# Patient Record
Sex: Male | Born: 1970 | Race: White | Hispanic: No | Marital: Single | Smoking: Current every day smoker
Health system: Southern US, Community
[De-identification: ages and names within clinical notes are randomized; demographics above are authoritative.]

## PROBLEM LIST (undated history)

## (undated) DIAGNOSIS — I10 Essential (primary) hypertension: Secondary | ICD-10-CM

## (undated) HISTORY — PX: KNEE SURGERY: SHX244

---

## 2010-03-07 ENCOUNTER — Emergency Department (HOSPITAL_COMMUNITY): Admission: EM | Admit: 2010-03-07 | Discharge: 2010-03-08 | Payer: Self-pay | Admitting: Emergency Medicine

## 2010-04-05 ENCOUNTER — Emergency Department (HOSPITAL_COMMUNITY): Admission: EM | Admit: 2010-04-05 | Discharge: 2010-04-05 | Payer: Self-pay | Admitting: Emergency Medicine

## 2010-07-15 ENCOUNTER — Emergency Department (HOSPITAL_COMMUNITY)
Admission: EM | Admit: 2010-07-15 | Discharge: 2010-07-15 | Payer: Self-pay | Source: Home / Self Care | Admitting: Emergency Medicine

## 2011-01-30 ENCOUNTER — Encounter: Payer: Self-pay | Admitting: *Deleted

## 2011-01-30 ENCOUNTER — Emergency Department (HOSPITAL_COMMUNITY)
Admission: EM | Admit: 2011-01-30 | Discharge: 2011-01-30 | Disposition: A | Discharge status: Home / Self Care | Payer: Self-pay | Attending: Emergency Medicine | Admitting: Emergency Medicine

## 2011-01-30 DIAGNOSIS — H6120 Impacted cerumen, unspecified ear: Secondary | ICD-10-CM | POA: Insufficient documentation

## 2011-01-30 DIAGNOSIS — K089 Disorder of teeth and supporting structures, unspecified: Secondary | ICD-10-CM | POA: Insufficient documentation

## 2011-01-30 DIAGNOSIS — K0889 Other specified disorders of teeth and supporting structures: Secondary | ICD-10-CM

## 2011-01-30 DIAGNOSIS — F17200 Nicotine dependence, unspecified, uncomplicated: Secondary | ICD-10-CM | POA: Insufficient documentation

## 2011-01-30 MED ORDER — HYDROCODONE-ACETAMINOPHEN 5-325 MG PO TABS: ORAL_TABLET | ORAL | Status: AC

## 2011-01-30 MED ORDER — PENICILLIN V POTASSIUM 250 MG PO TABS: 250.0000 mg | ORAL_TABLET | Freq: Four times a day (QID) | ORAL | Status: AC

## 2011-01-30 NOTE — ED Provider Notes (Signed)
History     CSN: [id number] Arrival date & time: 01/30/2011  6:30 PM  Chief Complaint  Patient presents with  . Dental Pain   HPI Pt was seen at 1940.  Per pt, c/o gradual onset and persistence of constant left lower tooth "pain" and well as left ear "stopped up with wax" x3 days.  States he tried OTC ear wax drop but "they didn't work."  Denies fevers, no intra-oral edema, no rash, no facial swelling, no dysphagia, no neck pain.   The condition is aggravated by nothing. The condition is relieved by nothing. The symptoms have been associated with no other complaints. The patient has no significant history of serious medical conditions.   History reviewed. No pertinent past medical history.  Past Surgical History  Procedure Date  . Knee surgery     rt    History reviewed. No pertinent family history.  History  Substance Use Topics  . Smoking status: Current Everyday Smoker -- 1.0 packs/day  . Smokeless tobacco: Not on file  . Alcohol Use: No      Review of Systems ROS: Statement: All systems negative except as marked or noted in the HPI; Constitutional: Negative for fever and chills. ; ; Eyes: Negative for eye pain and discharge. ; ; ENMT: Positive for dental caries, dental hygiene poor and toothache. Negative for ear pain, bleeding gums, dental injury, facial deformity, facial swelling, hoarseness, nasal congestion, sinus pressure, sore throat, throat swelling and tongue swollen. ; ; Cardiovascular: Negative for chest pain, palpitations, diaphoresis, dyspnea and peripheral edema. ; ; Respiratory: Negative for cough, wheezing and stridor. ; ; Gastrointestinal: Negative for nausea, vomiting, diarrhea and abdominal pain. ; ; Genitourinary: Negative for dysuria, flank pain and hematuria. ; ; Musculoskeletal: Negative for back pain and neck pain. ; ; Skin: Negative for rash and skin lesion. ; ; Neuro: Negative for headache, lightheadedness and neck stiffness.     Physical Exam  BP  148/98  Pulse 83  Temp(Src) 97.4 F (36.3 C) (Oral)  Resp 16  Ht 5\' 9"  (1.753 m)  Wt 165 lb (74.844 kg)  BMI 24.37 kg/m2  SpO2 100%  Physical Exam 1945: Physical examination: Vital signs and O2 SAT: Reviewed; Constitutional: Well developed, Well nourished, Well hydrated, In no acute distress; Head and Face: Normocephalic, Atraumatic; Eyes: EOMI, PERRL, No scleral icterus; ENMT: Mouth and pharynx normal, Poor dentition, Widespread dental decay, Left TM obscured by wax, canal without debris/edema/rash, Right TM normal, Mucous membranes moist, +lower left 1st and 2nd molars with extensive dental decay.  No gingival erythema, edema, fluctuance, or drainage.  No hoarse voice, no drooling, no stridor.  ; Neck: Supple, Full range of motion, No lymphadenopathy; Cardiovascular: Regular rate and rhythm, No murmur, rub, or gallop; Respiratory: Breath sounds clear & equal bilaterally, No rales, rhonchi, wheezes, or rub, Normal respiratory effort/excursion; Chest: Nontender, Movement normal; Extremities: Pulses normal, No tenderness, No edema; Neuro: AA&Ox3, Major CN grossly intact.  No gross focal motor or sensory deficits in extremities.; Skin: Color normal, No rash, No petechiae, Warm, Dry.   ED Course  Procedures  MDM MDM Reviewed: nursing note and vitals   7:52 PM:  Will have RN attempt to flush left ear of cerumen impaction.  Pt endorses he "just put the drops in and let them run out again."  Instructed further regarding home ear wax removal.  Verb understanding.     8:30 PM:  Wants to leave, does not want ear wax removal attempt in ED.  Will d/c.    Mylin Gignac Allison Quarry, DO 01/31/11 1411

## 2011-01-30 NOTE — ED Notes (Signed)
Toothache x 3 days. Unable to hear out of left ear x 3 days.

## 2011-04-12 ENCOUNTER — Emergency Department (HOSPITAL_COMMUNITY)
Admission: EM | Admit: 2011-04-12 | Discharge: 2011-04-12 | Disposition: A | Discharge status: Home / Self Care | Payer: Self-pay | Attending: Emergency Medicine | Admitting: Emergency Medicine

## 2011-04-12 ENCOUNTER — Encounter (HOSPITAL_COMMUNITY): Payer: Self-pay | Admitting: *Deleted

## 2011-04-12 DIAGNOSIS — K089 Disorder of teeth and supporting structures, unspecified: Secondary | ICD-10-CM

## 2011-04-12 DIAGNOSIS — F17200 Nicotine dependence, unspecified, uncomplicated: Secondary | ICD-10-CM | POA: Insufficient documentation

## 2011-04-12 DIAGNOSIS — R42 Dizziness and giddiness: Secondary | ICD-10-CM

## 2011-04-12 DIAGNOSIS — K029 Dental caries, unspecified: Secondary | ICD-10-CM | POA: Insufficient documentation

## 2011-04-12 LAB — BASIC METABOLIC PANEL
CO2: 26 mEq/L (ref 19–32)
Chloride: 104 mEq/L (ref 96–112)
Glucose, Bld: 101 mg/dL — ABNORMAL HIGH (ref 70–99)
Potassium: 4.2 mEq/L (ref 3.5–5.1)
Sodium: 137 mEq/L (ref 135–145)

## 2011-04-12 LAB — CBC
Hemoglobin: 14.5 g/dL (ref 13.0–17.0)
Platelets: 325 10*3/uL (ref 150–400)
RBC: 4.45 MIL/uL (ref 4.22–5.81)
WBC: 7.2 10*3/uL (ref 4.0–10.5)

## 2011-04-12 MED ORDER — HYDROCODONE-ACETAMINOPHEN 5-325 MG PO TABS: 1.0000 | ORAL_TABLET | Freq: Four times a day (QID) | ORAL | Status: AC | PRN

## 2011-04-12 MED ORDER — IBUPROFEN 800 MG PO TABS
800.0000 mg | ORAL_TABLET | Freq: Once | ORAL | Status: AC
  Administered 2011-04-12: 800 mg via ORAL
  Filled 2011-04-12: qty 1

## 2011-04-12 MED ORDER — PENICILLIN V POTASSIUM 500 MG PO TABS: 500.0000 mg | ORAL_TABLET | Freq: Four times a day (QID) | ORAL | Status: AC

## 2011-04-12 MED ORDER — PENICILLIN V POTASSIUM 250 MG PO TABS
500.0000 mg | ORAL_TABLET | Freq: Once | ORAL | Status: AC
  Administered 2011-04-12: 500 mg via ORAL
  Filled 2011-04-12: qty 2

## 2011-04-12 MED ORDER — HYDROCODONE-ACETAMINOPHEN 5-325 MG PO TABS
1.0000 | ORAL_TABLET | Freq: Once | ORAL | Status: AC
  Administered 2011-04-12: 1 via ORAL
  Filled 2011-04-12: qty 1

## 2011-04-12 NOTE — ED Provider Notes (Signed)
History     CSN: [id number] Arrival date & time: 04/12/2011 10:06 AM   First MD Initiated Contact with Patient 04/12/11 1037      Chief Complaint  Patient presents with  . Dizziness    (Consider location/radiation/quality/duration/timing/severity/associated sxs/prior treatment) HPI Comments: Pt complains of intermittent dizziness for the past 2 weeks.  He has oral pain with significant dental disease.  All the upper teeth are black and decayed to the gumline.  He thinks he may have "blood poisoning".  No fever or chills.  No head trama.  No focal neurologic complaints.  The history is provided by the patient. No language interpreter was used.    History reviewed. No pertinent past medical history.  Past Surgical History  Procedure Date  . Knee surgery     rt    History reviewed. No pertinent family history.  History  Substance Use Topics  . Smoking status: Current Everyday Smoker -- 1.0 packs/day  . Smokeless tobacco: Not on file  . Alcohol Use: No      Review of Systems  Constitutional: Negative for fever and chills.  HENT: Positive for dental problem.   Neurological: Positive for dizziness. Negative for syncope, facial asymmetry, speech difficulty, weakness and headaches.  All other systems reviewed and are negative.    Allergies  Review of patient's allergies indicates no known allergies.  Home Medications   Current Outpatient Rx  Name Route Sig Dispense Refill  . IBUPROFEN 200 MG PO TABS Oral Take 600 mg by mouth once as needed. For pain    . EAR DROPS OT Otic Place 5 drops in ear(s) once as needed. For wax reomoval       BP 141/97  Pulse 100  Temp(Src) 98 F (36.7 C) (Oral)  Resp 18  Ht 5\' 8"  (1.727 m)  Wt 165 lb (74.844 kg)  BMI 25.09 kg/m2  SpO2 100%  Physical Exam  Nursing note and vitals reviewed. Constitutional: He is oriented to person, place, and time. He appears well-developed and well-nourished.  HENT:  Head: Normocephalic and  atraumatic.  Mouth/Throat: Uvula is midline. Abnormal dentition. Dental caries present. No uvula swelling.       All upper teeth are decayed to the gumline.  Eyes: EOM are normal.  Neck: Normal range of motion.  Cardiovascular: Normal rate, regular rhythm, normal heart sounds and intact distal pulses.   Pulmonary/Chest: Effort normal and breath sounds normal. No respiratory distress.  Abdominal: Soft. He exhibits no distension. There is no tenderness.  Musculoskeletal: Normal range of motion.  Neurological: He is alert and oriented to person, place, and time. No cranial nerve deficit. Coordination normal.  Skin: Skin is warm and dry.  Psychiatric: He has a normal mood and affect. Judgment normal.    ED Course  Procedures (including critical care time)  Labs Reviewed  BASIC METABOLIC PANEL - Abnormal; Notable for the following:    Glucose, Bld 101 (*)    GFR calc non Af Amer 81 (*)    All other components within normal limits  CBC   No results found.   No diagnosis found.    MDM        Worthy Rancher, PA 04/12/11 1210

## 2011-04-12 NOTE — ED Notes (Signed)
Pt c/o dizziness and dental pain. Pt states he has had dental pain for a long time and feels like he has an infection.

## 2011-04-14 NOTE — ED Provider Notes (Signed)
Medical screening examination/treatment/procedure(s) were performed by non-physician practitioner and as supervising physician I was immediately available for consultation/collaboration.   Alby L Lawana Hartzell, MD 04/14/11 0903 

## 2011-08-22 ENCOUNTER — Encounter (HOSPITAL_COMMUNITY): Payer: Self-pay | Admitting: *Deleted

## 2011-08-22 ENCOUNTER — Emergency Department (HOSPITAL_COMMUNITY)
Admission: EM | Admit: 2011-08-22 | Discharge: 2011-08-22 | Disposition: A | Discharge status: Home / Self Care | Payer: Self-pay | Attending: Emergency Medicine | Admitting: Emergency Medicine

## 2011-08-22 DIAGNOSIS — K044 Acute apical periodontitis of pulpal origin: Secondary | ICD-10-CM | POA: Insufficient documentation

## 2011-08-22 DIAGNOSIS — K0381 Cracked tooth: Secondary | ICD-10-CM | POA: Insufficient documentation

## 2011-08-22 DIAGNOSIS — K029 Dental caries, unspecified: Secondary | ICD-10-CM | POA: Insufficient documentation

## 2011-08-22 DIAGNOSIS — K047 Periapical abscess without sinus: Secondary | ICD-10-CM

## 2011-08-22 DIAGNOSIS — F17200 Nicotine dependence, unspecified, uncomplicated: Secondary | ICD-10-CM | POA: Insufficient documentation

## 2011-08-22 MED ORDER — AMOXICILLIN 250 MG PO CAPS
500.0000 mg | ORAL_CAPSULE | Freq: Once | ORAL | Status: AC
  Administered 2011-08-22: 500 mg via ORAL
  Filled 2011-08-22: qty 2

## 2011-08-22 MED ORDER — AMOXICILLIN 500 MG PO CAPS: 500.0000 mg | ORAL_CAPSULE | Freq: Three times a day (TID) | ORAL | Status: AC

## 2011-08-22 MED ORDER — HYDROCODONE-ACETAMINOPHEN 5-325 MG PO TABS
1.0000 | ORAL_TABLET | Freq: Once | ORAL | Status: AC
  Administered 2011-08-22: 1 via ORAL
  Filled 2011-08-22: qty 1

## 2011-08-22 MED ORDER — HYDROCODONE-ACETAMINOPHEN 5-325 MG PO TABS: 1.0000 | ORAL_TABLET | Freq: Once | ORAL | Status: AC

## 2011-08-22 NOTE — ED Provider Notes (Signed)
Medical screening examination/treatment/procedure(s) were performed by non-physician practitioner and as supervising physician I was immediately available for consultation/collaboration.   Renner L Takako Minckler, MD 08/22/11 2316 

## 2011-08-22 NOTE — ED Notes (Signed)
Lt dental pain for several days

## 2011-08-22 NOTE — ED Notes (Signed)
Pt dc to home with steady gait. 

## 2011-08-22 NOTE — ED Provider Notes (Signed)
History     CSN: [id number]  Arrival date & time 08/22/11  4098   First MD Initiated Contact with Patient 08/22/11 1930      Chief Complaint  Patient presents with  . Dental Pain    (Consider location/radiation/quality/duration/timing/severity/associated sxs/prior treatment) Patient is a 41 y.o. male presenting with tooth pain. The history is provided by the patient.  Dental PainThe primary symptoms include mouth pain. Primary symptoms do not include headaches, fever, shortness of breath or sore throat. Primary symptoms comment: Patient has chronically poor dentition with multiple fractured teeth down to the gingival line, predominantly involving his bilateral upper and lower molars. The symptoms began 2 days ago. The symptoms are worsening. The symptoms are recurrent. The symptoms occur constantly.  Mouth pain began 24 -48 hours ago. Mouth pain occurs constantly. Affected locations include: teeth and gum(s). At its highest the mouth pain was at 8/10. The mouth pain is currently at 8/10.  Additional symptoms include: dental sensitivity to temperature, gum tenderness, jaw pain and ear pain. Additional symptoms do not include: gum swelling, trismus, facial swelling, trouble swallowing, taste disturbance, hearing loss and swollen glands.    History reviewed. No pertinent past medical history.  Past Surgical History  Procedure Date  . Knee surgery     rt    History reviewed. No pertinent family history.  History  Substance Use Topics  . Smoking status: Current Everyday Smoker -- 1.0 packs/day  . Smokeless tobacco: Not on file  . Alcohol Use: No      Review of Systems  Constitutional: Negative for fever.  HENT: Positive for ear pain and dental problem. Negative for hearing loss, congestion, sore throat, facial swelling, trouble swallowing and neck pain.   Eyes: Negative.   Respiratory: Negative for chest tightness and shortness of breath.   Cardiovascular: Negative for chest  pain.  Gastrointestinal: Negative for nausea and abdominal pain.  Genitourinary: Negative.   Musculoskeletal: Negative for joint swelling and arthralgias.  Skin: Negative.  Negative for rash and wound.  Neurological: Negative for dizziness, weakness, light-headedness, numbness and headaches.  Hematological: Negative.   Psychiatric/Behavioral: Negative.     Allergies  Review of patient's allergies indicates no known allergies.  Home Medications   Current Outpatient Rx  Name Route Sig Dispense Refill  . AMOXICILLIN 500 MG PO CAPS Oral Take 1 capsule (500 mg total) by mouth 3 (three) times daily. 30 capsule 0  . EAR DROPS OT Otic Place 5 drops in ear(s) once as needed. For wax reomoval     . HYDROCODONE-ACETAMINOPHEN 5-325 MG PO TABS Oral Take 1 tablet by mouth once. 20 tablet 0  . IBUPROFEN 200 MG PO TABS Oral Take 600 mg by mouth once as needed. For pain      BP 151/105  Pulse 104  Temp(Src) 98.2 F (36.8 C) (Oral)  Resp 20  Ht 5\' 8"  (1.727 m)  Wt 160 lb (72.576 kg)  BMI 24.33 kg/m2  SpO2 100%  Physical Exam  Constitutional: He is oriented to person, place, and time. He appears well-developed and well-nourished. No distress.  HENT:  Head: Normocephalic and atraumatic.  Right Ear: Tympanic membrane and external ear normal.  Left Ear: Tympanic membrane and external ear normal.  Mouth/Throat: Oropharynx is clear and moist and mucous membranes are normal. No oral lesions. Dental caries present. No dental abscesses.  Eyes: Conjunctivae are normal.  Neck: Normal range of motion. Neck supple.  Cardiovascular: Normal rate and normal heart sounds.   Pulmonary/Chest: Effort  normal.  Abdominal: He exhibits no distension.  Musculoskeletal: Normal range of motion.  Lymphadenopathy:    He has no cervical adenopathy.  Neurological: He is alert and oriented to person, place, and time.  Skin: Skin is warm and dry. No erythema.  Psychiatric: He has a normal mood and affect.    ED  Course  Procedures (including critical care time)  Labs Reviewed - No data to display No results found.   1. Dental infection       MDM  Severe diffuse dental disease with recurrent increased pain, suggestive of possible early abscess/gingivitis.  Will treat with penicillin, hydrocodone.  Encouraged peroxide and water "swish and spit" treatment.  We'll give dental referral list.       Candis Musa, [place] 08/22/11 [phone]

## 2012-04-24 ENCOUNTER — Emergency Department (HOSPITAL_COMMUNITY): Payer: Self-pay

## 2012-04-24 ENCOUNTER — Emergency Department (HOSPITAL_COMMUNITY)
Admission: EM | Admit: 2012-04-24 | Discharge: 2012-04-24 | Disposition: A | Discharge status: Home / Self Care | Payer: Self-pay | Attending: Emergency Medicine | Admitting: Emergency Medicine

## 2012-04-24 ENCOUNTER — Encounter (HOSPITAL_COMMUNITY): Payer: Self-pay | Admitting: *Deleted

## 2012-04-24 DIAGNOSIS — S59909A Unspecified injury of unspecified elbow, initial encounter: Secondary | ICD-10-CM | POA: Insufficient documentation

## 2012-04-24 DIAGNOSIS — F17200 Nicotine dependence, unspecified, uncomplicated: Secondary | ICD-10-CM | POA: Insufficient documentation

## 2012-04-24 DIAGNOSIS — Y93E9 Activity, other interior property and clothing maintenance: Secondary | ICD-10-CM | POA: Insufficient documentation

## 2012-04-24 DIAGNOSIS — Y929 Unspecified place or not applicable: Secondary | ICD-10-CM | POA: Insufficient documentation

## 2012-04-24 DIAGNOSIS — S6990XA Unspecified injury of unspecified wrist, hand and finger(s), initial encounter: Secondary | ICD-10-CM | POA: Insufficient documentation

## 2012-04-24 DIAGNOSIS — M25521 Pain in right elbow: Secondary | ICD-10-CM

## 2012-04-24 DIAGNOSIS — X503XXA Overexertion from repetitive movements, initial encounter: Secondary | ICD-10-CM | POA: Insufficient documentation

## 2012-04-24 MED ORDER — HYDROCODONE-ACETAMINOPHEN 5-325 MG PO TABS: 1.0000 | ORAL_TABLET | Freq: Four times a day (QID) | ORAL | Status: AC | PRN

## 2012-04-24 MED ORDER — HYDROCODONE-ACETAMINOPHEN 5-325 MG PO TABS
1.0000 | ORAL_TABLET | Freq: Once | ORAL | Status: AC
  Administered 2012-04-24: 1 via ORAL
  Filled 2012-04-24: qty 1

## 2012-04-24 NOTE — ED Provider Notes (Signed)
History     CSN: [id number]  Arrival date & time 04/24/12  1751   None     Chief Complaint  Patient presents with  . Elbow Pain    (Consider location/radiation/quality/duration/timing/severity/associated sxs/prior treatment) HPI Comments: Pt was raking leaves and felt a "pop" in his R AC fossa.  He has had pain since and is unable to fully extend elbow b/c of pain.  R hand dominant.  No other complaints.  The history is provided by the patient. No language interpreter was used.    History reviewed. No pertinent past medical history.  Past Surgical History  Procedure Date  . Knee surgery     rt    History reviewed. No pertinent family history.  History  Substance Use Topics  . Smoking status: Current Every Day Smoker -- 1.0 packs/day  . Smokeless tobacco: Not on file  . Alcohol Use: No      Review of Systems  Musculoskeletal:       Elbow pain   Neurological: Negative for weakness and numbness.  All other systems reviewed and are negative.    Allergies  Review of patient's allergies indicates no known allergies.  Home Medications   Current Outpatient Rx  Name  Route  Sig  Dispense  Refill  . EAR DROPS OT   Otic   Place 5 drops in ear(s) once as needed. For wax reomoval          . IBUPROFEN 200 MG PO TABS   Oral   Take 600 mg by mouth once as needed. For pain           BP 170/108  Pulse 78  Temp 98.2 F (36.8 C) (Oral)  Resp 20  Ht 5\' 8"  (1.727 m)  Wt 160 lb (72.576 kg)  BMI 24.33 kg/m2  SpO2 99%  Physical Exam  Nursing note and vitals reviewed. Constitutional: He is oriented to person, place, and time. He appears well-developed and well-nourished.  HENT:  Head: Normocephalic and atraumatic.  Eyes: EOM are normal.  Neck: Normal range of motion.  Cardiovascular: Normal rate, regular rhythm and intact distal pulses.   Pulmonary/Chest: Effort normal. No respiratory distress.  Abdominal: Soft. He exhibits no distension. There is no  tenderness.  Musculoskeletal:       Left elbow: He exhibits decreased range of motion. He exhibits no swelling, no effusion, no deformity and no laceration. tenderness found.       Arms: Neurological: He is alert and oriented to person, place, and time.  Skin: Skin is warm and dry.  Psychiatric: He has a normal mood and affect. Judgment normal.    ED Course  Procedures (including critical care time)  Labs Reviewed - No data to display Dg Elbow Complete Right  04/24/2012  *RADIOLOGY REPORT*  Clinical Data: Pain  RIGHT ELBOW - COMPLETE 3+ VIEW  Comparison: April 05, 2010  Findings:  Frontal, lateral, and bilateral oblique views were obtained. There is a spur arising from the olecranon.  There is a fracture of the spur, nondisplaced.  There is evidence of a prior a avulsion along the anterior, lateral aspect of the proximal radius. This is a stable finding.  There is evidence of old trauma with ossification in the anterior aspect of the joint, a stable finding.  There is no appreciable joint effusion.  No dislocation.  There is a moderate generalized joint space narrowing.  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  Age uncertain fracture of the olecranon spur.  Original Report Authenticated By: Bretta Bang, M.D.      1. Right elbow pain       MDM  Sling, ice Ibuprofen rx-hydrocodone, 20 F/u with dr. Hilda Lias ASAP.        Evalina Field, [place] 04/24/12 1902

## 2012-04-24 NOTE — ED Provider Notes (Signed)
Medical screening examination/treatment/procedure(s) were performed by non-physician practitioner and as supervising physician I was immediately available for consultation/collaboration.  Magally Vahle, MD 04/24/12 1957 

## 2012-04-24 NOTE — ED Notes (Signed)
Pain rt elbow while raking leaves. Has small swollen area, No known injury, Good radial pulse , Cannot fully extend arm

## 2013-01-13 ENCOUNTER — Emergency Department (HOSPITAL_COMMUNITY): Payer: Self-pay

## 2013-01-13 ENCOUNTER — Encounter (HOSPITAL_COMMUNITY): Payer: Self-pay | Admitting: Emergency Medicine

## 2013-01-13 ENCOUNTER — Emergency Department (HOSPITAL_COMMUNITY)
Admission: EM | Admit: 2013-01-13 | Discharge: 2013-01-13 | Disposition: A | Discharge status: Home / Self Care | Payer: Self-pay | Attending: Emergency Medicine | Admitting: Emergency Medicine

## 2013-01-13 DIAGNOSIS — Z23 Encounter for immunization: Secondary | ICD-10-CM | POA: Insufficient documentation

## 2013-01-13 DIAGNOSIS — S51032A Puncture wound without foreign body of left elbow, initial encounter: Secondary | ICD-10-CM

## 2013-01-13 DIAGNOSIS — Y9389 Activity, other specified: Secondary | ICD-10-CM | POA: Insufficient documentation

## 2013-01-13 DIAGNOSIS — S51009A Unspecified open wound of unspecified elbow, initial encounter: Secondary | ICD-10-CM | POA: Insufficient documentation

## 2013-01-13 DIAGNOSIS — Y929 Unspecified place or not applicable: Secondary | ICD-10-CM | POA: Insufficient documentation

## 2013-01-13 DIAGNOSIS — W268XXA Contact with other sharp object(s), not elsewhere classified, initial encounter: Secondary | ICD-10-CM | POA: Insufficient documentation

## 2013-01-13 DIAGNOSIS — F17200 Nicotine dependence, unspecified, uncomplicated: Secondary | ICD-10-CM | POA: Insufficient documentation

## 2013-01-13 MED ORDER — CEFTRIAXONE SODIUM 1 G IJ SOLR
1.0000 g | Freq: Once | INTRAMUSCULAR | Status: AC
  Administered 2013-01-13: 1 g via INTRAMUSCULAR
  Filled 2013-01-13: qty 10

## 2013-01-13 MED ORDER — HYDROCODONE-ACETAMINOPHEN 5-325 MG PO TABS: 1.0000 | ORAL_TABLET | ORAL | Status: DC | PRN

## 2013-01-13 MED ORDER — KETOROLAC TROMETHAMINE 10 MG PO TABS
10.0000 mg | ORAL_TABLET | Freq: Once | ORAL | Status: AC
  Administered 2013-01-13: 10 mg via ORAL
  Filled 2013-01-13: qty 1

## 2013-01-13 MED ORDER — TETANUS-DIPHTH-ACELL PERTUSSIS 5-2.5-18.5 LF-MCG/0.5 IM SUSP
0.5000 mL | Freq: Once | INTRAMUSCULAR | Status: AC
  Administered 2013-01-13: 0.5 mL via INTRAMUSCULAR
  Filled 2013-01-13: qty 0.5

## 2013-01-13 MED ORDER — ACETAMINOPHEN 500 MG PO TABS
1000.0000 mg | ORAL_TABLET | Freq: Once | ORAL | Status: AC
  Administered 2013-01-13: 1000 mg via ORAL
  Filled 2013-01-13: qty 2

## 2013-01-13 MED ORDER — SULFAMETHOXAZOLE-TRIMETHOPRIM 800-160 MG PO TABS: 1.0000 | ORAL_TABLET | Freq: Two times a day (BID) | ORAL | Status: DC

## 2013-01-13 NOTE — ED Notes (Signed)
Roofing yesterday - slipped and struck elbow on a screw - today has pain, swelling and redness to elbow

## 2013-01-13 NOTE — ED Provider Notes (Signed)
Medical screening examination/treatment/procedure(s) were performed by non-physician practitioner and as supervising physician I was immediately available for consultation/collaboration. Devoria Albe, MD, Armando Gang   Ward Givens, MD 01/13/13 [phone]

## 2013-01-13 NOTE — ED Provider Notes (Signed)
CSN: [id number]     Arrival date & time 01/13/13  1910 History     First MD Initiated Contact with Patient 01/13/13 1933     Chief Complaint  Patient presents with  . Arm Injury   (Consider location/radiation/quality/duration/timing/severity/associated sxs/prior Treatment) Patient is a 42 y.o. male presenting with arm injury. The history is provided by the patient.  Arm Injury Location:  Elbow Time since incident:  1 day Injury: yes   Mechanism of injury comment:  Puncture wound with a screw, while working on a roof. Elbow location:  L elbow Pain details:    Quality:  Aching and shooting   Radiates to:  L arm   Severity:  Moderate   Onset quality:  Gradual   Timing:  Constant   Progression:  Worsening Chronicity:  New Handedness:  Right-handed Dislocation: no   Foreign body present:  No foreign bodies Tetanus status:  Out of date Prior injury to area:  No Relieved by:  Nothing Worsened by:  Movement Ineffective treatments:  NSAIDs Associated symptoms: no back pain, no neck pain and no numbness   Associated symptoms comment:  Pain and redness.   History reviewed. No pertinent past medical history. Past Surgical History  Procedure Laterality Date  . Knee surgery      rt   No family history on file. History  Substance Use Topics  . Smoking status: Current Every Day Smoker -- 1.00 packs/day  . Smokeless tobacco: Not on file  . Alcohol Use: No    Review of Systems  Constitutional: Negative for activity change.       All ROS Neg except as noted in HPI  HENT: Negative for nosebleeds and neck pain.   Eyes: Negative for photophobia and discharge.  Respiratory: Negative for cough, shortness of breath and wheezing.   Cardiovascular: Negative for chest pain and palpitations.  Gastrointestinal: Negative for abdominal pain and blood in stool.  Genitourinary: Negative for dysuria, frequency and hematuria.  Musculoskeletal: Negative for back pain and arthralgias.  Skin:  Negative.   Neurological: Negative for dizziness, seizures and speech difficulty.  Psychiatric/Behavioral: Negative for hallucinations and confusion.    Allergies  Review of patient's allergies indicates no known allergies.  Home Medications   Current Outpatient Rx  Name  Route  Sig  Dispense  Refill  . ibuprofen (ADVIL,MOTRIN) 200 MG tablet   Oral   Take 600 mg by mouth every 6 (six) hours as needed for pain. For pain          BP 137/96  Pulse 99  Temp(Src) 97.6 F (36.4 C) (Oral)  Resp 20  Ht 5\' 8"  (1.727 m)  Wt 160 lb (72.576 kg)  BMI 24.33 kg/m2  SpO2 98% Physical Exam  Nursing note and vitals reviewed. Constitutional: He is oriented to person, place, and time. He appears well-developed and well-nourished.  Non-toxic appearance.  HENT:  Head: Normocephalic.  Right Ear: Tympanic membrane and external ear normal.  Left Ear: Tympanic membrane and external ear normal.  Eyes: EOM and lids are normal. Pupils are equal, round, and reactive to light.  Neck: Normal range of motion. Neck supple. Carotid bruit is not present.  Cardiovascular: Normal rate, regular rhythm, normal heart sounds, intact distal pulses and normal pulses.   Pulmonary/Chest: Breath sounds normal. No respiratory distress.  Abdominal: Soft. Bowel sounds are normal. There is no tenderness. There is no guarding.  Musculoskeletal:       Left elbow: He exhibits swelling. Tenderness found.  Arms: FROM of the fingers, wrist, and shoulder on the left. Soreness and mod pain with ROM of the left elbow. Puncture wound just below the left elbow. No palpable abscess.  Lymphadenopathy:       Head (right side): No submandibular adenopathy present.       Head (left side): No submandibular adenopathy present.    He has no cervical adenopathy.  Neurological: He is alert and oriented to person, place, and time. He has normal strength. No cranial nerve deficit or sensory deficit.  Skin: Skin is warm and dry.    Psychiatric: He has a normal mood and affect. His speech is normal.    ED Course   Procedures (including critical care time)  Labs Reviewed - No data to display Dg Elbow Complete Left  01/13/2013   *RADIOLOGY REPORT*  Clinical Data: Pain post trauma  LEFT ELBOW - COMPLETE 3+ VIEW  Comparison: None.  Findings:  Frontal, lateral, and bilateral oblique views were obtained.  There is no fracture, dislocation, or effusion.  Joint spaces appear intact.  There is a minimal spur arising from the olecranon process of the proximal ulna.  IMPRESSION: No fracture or effusion.   Original Report Authenticated By: Bretta Bang, M.D.   No diagnosis found.  MDM  *I have reviewed nursing notes, vital signs, and all appropriate lab and imaging results for this patient.** Pt sustained a puncture wound to the left elbow. FROM of the fingers, wrist, and shoulder. Increase redness and pain of the left elbow. Vital sign stable. Plan - Rocephin and tetanus given to the patient in the ED. Rx for bactrim, and norco given to the patient. Pt to use ibuprofen three times daily for swelling  And soreness.  Kathie Dike, PA-C 01/13/13 2016

## 2013-03-31 ENCOUNTER — Emergency Department (HOSPITAL_COMMUNITY)
Admission: EM | Admit: 2013-03-31 | Discharge: 2013-03-31 | Discharge status: Against Medical Advice | Payer: Self-pay | Attending: Emergency Medicine | Admitting: Emergency Medicine

## 2013-03-31 ENCOUNTER — Encounter (HOSPITAL_COMMUNITY): Payer: Self-pay | Admitting: Emergency Medicine

## 2013-03-31 DIAGNOSIS — H538 Other visual disturbances: Secondary | ICD-10-CM | POA: Insufficient documentation

## 2013-03-31 DIAGNOSIS — F17200 Nicotine dependence, unspecified, uncomplicated: Secondary | ICD-10-CM | POA: Insufficient documentation

## 2013-03-31 LAB — GLUCOSE, CAPILLARY: Glucose-Capillary: 81 mg/dL (ref 70–99)

## 2013-03-31 NOTE — ED Notes (Signed)
1348 called to treatment area. No response. Pt not in waiting room.   1500 pt still not in waiting room.   1520 pt called again. No response

## 2013-03-31 NOTE — ED Notes (Signed)
Complain of vision blurry for about a month

## 2013-06-16 ENCOUNTER — Emergency Department (HOSPITAL_COMMUNITY)
Admission: EM | Admit: 2013-06-16 | Discharge: 2013-06-16 | Disposition: A | Discharge status: Home / Self Care | Payer: Self-pay | Attending: Emergency Medicine | Admitting: Emergency Medicine

## 2013-06-16 ENCOUNTER — Encounter (HOSPITAL_COMMUNITY): Payer: Self-pay | Admitting: Emergency Medicine

## 2013-06-16 DIAGNOSIS — IMO0002 Reserved for concepts with insufficient information to code with codable children: Secondary | ICD-10-CM | POA: Insufficient documentation

## 2013-06-16 DIAGNOSIS — H9190 Unspecified hearing loss, unspecified ear: Secondary | ICD-10-CM | POA: Insufficient documentation

## 2013-06-16 DIAGNOSIS — Y929 Unspecified place or not applicable: Secondary | ICD-10-CM | POA: Insufficient documentation

## 2013-06-16 DIAGNOSIS — F17200 Nicotine dependence, unspecified, uncomplicated: Secondary | ICD-10-CM | POA: Insufficient documentation

## 2013-06-16 DIAGNOSIS — H6122 Impacted cerumen, left ear: Secondary | ICD-10-CM

## 2013-06-16 DIAGNOSIS — T162XXA Foreign body in left ear, initial encounter: Secondary | ICD-10-CM

## 2013-06-16 DIAGNOSIS — T169XXA Foreign body in ear, unspecified ear, initial encounter: Secondary | ICD-10-CM | POA: Insufficient documentation

## 2013-06-16 DIAGNOSIS — H6120 Impacted cerumen, unspecified ear: Secondary | ICD-10-CM | POA: Insufficient documentation

## 2013-06-16 DIAGNOSIS — Y939 Activity, unspecified: Secondary | ICD-10-CM | POA: Insufficient documentation

## 2013-06-16 MED ORDER — ANTIPYRINE-BENZOCAINE 5.4-1.4 % OT SOLN
3.0000 [drp] | Freq: Once | OTIC | Status: AC
  Administered 2013-06-16: 4 [drp] via OTIC
  Filled 2013-06-16: qty 10

## 2013-06-16 NOTE — ED Notes (Signed)
Lt ear pain with decreased hearing

## 2013-06-16 NOTE — ED Provider Notes (Signed)
CSN: [id number]     Arrival date & time 06/16/13  1819 History   First MD Initiated Contact with Patient 06/16/13 2055     Chief Complaint  Patient presents with  . Otalgia   (Consider location/radiation/quality/duration/timing/severity/associated sxs/prior Treatment) HPI Comments: Paul Mccullough is a 42 y.o. male who presents to the Emergency Department complaining of left ear pain with decreased hearing for several days.  States "it feel like something in my ear".  He denies fever, sore throat, dizziness, vomiting or headache.  Nothing makes the pain better or worse.  He has not tried any medications or therapies at home.     The history is provided by the patient.    History reviewed. No pertinent past medical history. Past Surgical History  Procedure Laterality Date  . Knee surgery      rt   History reviewed. No pertinent family history. History  Substance Use Topics  . Smoking status: Current Every Day Smoker -- 1.00 packs/day  . Smokeless tobacco: Not on file  . Alcohol Use: No    Review of Systems  Constitutional: Negative for fever, chills, activity change and appetite change.  HENT: Positive for ear pain and hearing loss. Negative for congestion, ear discharge, facial swelling, rhinorrhea, sore throat and trouble swallowing.   Eyes: Negative for visual disturbance.  Respiratory: Negative for cough and shortness of breath.   Gastrointestinal: Negative for nausea and vomiting.  Musculoskeletal: Negative for neck pain and neck stiffness.  Skin: Negative.   Neurological: Negative for dizziness, weakness, numbness and headaches.  Hematological: Negative for adenopathy.  Psychiatric/Behavioral: Negative for confusion.  All other systems reviewed and are negative.    Allergies  Review of patient's allergies indicates no known allergies.  Home Medications   Current Outpatient Rx  Name  Route  Sig  Dispense  Refill  . ibuprofen (ADVIL,MOTRIN) 200 MG tablet    Oral   Take 600 mg by mouth every 6 (six) hours as needed for pain. For pain          BP 140/96  Pulse 92  Temp(Src) 97.4 F (36.3 C) (Oral)  Resp 20  Ht 5\' 8"  (1.727 m)  Wt 160 lb (72.576 kg)  BMI 24.33 kg/m2  SpO2 100% Physical Exam  Nursing note and vitals reviewed. Constitutional: He appears well-developed and well-nourished. No distress.  HENT:  Head: Normocephalic and atraumatic.  Right Ear: Tympanic membrane, external ear and ear canal normal.  Left Ear: A foreign body is present.  Mouth/Throat: Oropharynx is clear and moist.  Insect to the left ear canal with moderate amt of cerumen.  TM not visualized due to the cerumen  Neck: Normal range of motion. Neck supple.  Cardiovascular: Normal rate, regular rhythm, normal heart sounds and intact distal pulses.   No murmur heard. Pulmonary/Chest: Effort normal and breath sounds normal. No respiratory distress. He exhibits no tenderness.  Musculoskeletal: Normal range of motion.  Lymphadenopathy:    He has no cervical adenopathy.  Neurological: He is alert. He exhibits normal muscle tone. Coordination normal.  Skin: Skin is warm and dry.    ED Course  Procedures (including critical care time) Labs Review Labs Reviewed - No data to display Imaging Review No results found.  EKG Interpretation   None       MDM    Insect removed from the left ear canal by me using a lighted curette.  Patient has large cerumen impaction as well that was partially removed with irrigation and  auralgan.  Pt is feeling better, hearing improved.  Agrees to continue the auralgan and f/u with the health dept if needed.  Appears stable for discharge  Kamyla Olejnik L. Trisha Mangle, PA-C 06/17/13 [phone]

## 2013-06-17 NOTE — ED Provider Notes (Signed)
  Medical screening examination/treatment/procedure(s) were performed by non-physician practitioner and as supervising physician I was immediately available for consultation/collaboration.  EKG Interpretation   None          Rheagan Nayak, MD 06/17/13 1600 

## 2013-08-05 ENCOUNTER — Encounter (HOSPITAL_COMMUNITY): Payer: Self-pay | Admitting: Emergency Medicine

## 2013-08-05 ENCOUNTER — Emergency Department (HOSPITAL_COMMUNITY): Payer: 59

## 2013-08-05 ENCOUNTER — Emergency Department (HOSPITAL_COMMUNITY)
Admission: EM | Admit: 2013-08-05 | Discharge: 2013-08-05 | Disposition: A | Discharge status: Home / Self Care | Payer: 59 | Attending: Emergency Medicine | Admitting: Emergency Medicine

## 2013-08-05 DIAGNOSIS — S99929A Unspecified injury of unspecified foot, initial encounter: Principal | ICD-10-CM

## 2013-08-05 DIAGNOSIS — Y929 Unspecified place or not applicable: Secondary | ICD-10-CM | POA: Insufficient documentation

## 2013-08-05 DIAGNOSIS — W11XXXA Fall on and from ladder, initial encounter: Secondary | ICD-10-CM | POA: Insufficient documentation

## 2013-08-05 DIAGNOSIS — S8990XA Unspecified injury of unspecified lower leg, initial encounter: Secondary | ICD-10-CM | POA: Insufficient documentation

## 2013-08-05 DIAGNOSIS — S99919A Unspecified injury of unspecified ankle, initial encounter: Principal | ICD-10-CM

## 2013-08-05 DIAGNOSIS — Z9889 Other specified postprocedural states: Secondary | ICD-10-CM | POA: Insufficient documentation

## 2013-08-05 DIAGNOSIS — Y939 Activity, unspecified: Secondary | ICD-10-CM | POA: Insufficient documentation

## 2013-08-05 DIAGNOSIS — F17200 Nicotine dependence, unspecified, uncomplicated: Secondary | ICD-10-CM | POA: Insufficient documentation

## 2013-08-05 DIAGNOSIS — M25561 Pain in right knee: Secondary | ICD-10-CM

## 2013-08-05 MED ORDER — HYDROCODONE-ACETAMINOPHEN 5-325 MG PO TABS: ORAL_TABLET | ORAL | Status: DC

## 2013-08-05 MED ORDER — OXYCODONE-ACETAMINOPHEN 5-325 MG PO TABS
2.0000 | ORAL_TABLET | Freq: Once | ORAL | Status: AC
  Administered 2013-08-05: 2 via ORAL
  Filled 2013-08-05: qty 2

## 2013-08-05 MED ORDER — NAPROXEN 500 MG PO TABS: 500.0000 mg | ORAL_TABLET | Freq: Two times a day (BID) | ORAL | Status: DC

## 2013-08-05 NOTE — Discharge Instructions (Signed)
Knee Pain °Knee pain can be a result of an injury or other medical conditions. Treatment will depend on the cause of your pain. °HOME CARE °· Only take medicine as told by your doctor. °· Keep a healthy weight. Being overweight can make the knee hurt more. °· Stretch before exercising or playing sports. °· If there is constant knee pain, change the way you exercise. Ask your doctor for advice. °· Make sure shoes fit well. Choose the right shoe for the sport or activity. °· Protect your knees. Wear kneepads if needed. °· Rest when you are tired. °GET HELP RIGHT AWAY IF:  °· Your knee pain does not stop. °· Your knee pain does not get better. °· Your knee joint feels hot to the touch. °· You have a fever. °MAKE SURE YOU:  °· Understand these instructions. °· Will watch this condition. °· Will get help right away if you are not doing well or get worse. °Document Released: 09/01/2008 Document Revised: 08/28/2011 Document Reviewed: 09/01/2008 °Exit[employer]® Patient Information ©2014 [employer]. ° °Knee Bracing °Knee braces are supports to help stabilize and protect an injured or painful knee. They come in many different styles. They should support and protect the knee without increasing the chance of other injuries to yourself or others. It is important not to have a false sense of security when using a brace. Knee braces that help you to keep using your knee: °· Do not restore normal knee stability under high stress forces. °· May decrease some aspects of athletic performance. °Some of the different types of knee braces are: °· Prophylactic knee braces are designed to prevent or reduce the severity of knee injuries during sports that make injury to the knee more likely. °· Rehabilitative knee braces are designed to allow protected motion of: °· Injured knees. °· Knees that have been treated with or without surgery. °There is no evidence that the use of a supportive knee brace protects the graft following a successful  anterior cruciate ligament (ACL) reconstruction. However, braces are sometimes used to:  °· Protect injured ligaments. °· Control knee movement during the initial healing period. °They may be used as part of the treatment program for the various injured ligaments or cartilage of the knee including the: °· Anterior cruciate ligament. °· Medial collateral ligament. °· Medial or lateral cartilage (meniscus). °· Posterior cruciate ligament. °· Lateral collateral ligament. °Rehabilitative knee braces are most commonly used: °· During crutch-assisted walking right after injury. °· During crutch-assisted walking right after surgery to repair the cartilage and/or cruciate ligament injury. °· For a short period of time, 2-8 weeks, after the injury or surgery. °The value of a rehabilitative brace as opposed to a cast or splint includes the: °· Ability to adjust the brace for swelling. °· Ability to remove the brace for examinations, icing or showering. °· Ability to allow for movement in a controlled range of motion. °Functional knee braces give support to knees that have already been injured. They are designed to provide stability for the injured knee and provide protection after repair. Functional knee braces may not affect performance much. Lower extremity muscle strengthening, flexibility, and improvement in technique are more important than bracing in treating ligamentous knee injuries. Functional braces are not a substitute for rehabilitation or surgical procedures. °Unloader/offloader braces are designed to provide pain relief in arthritic knees. Patients with wear and tear arthritis from growing old or from an old cartilage injury (osteoarthritis) of the knee, and bow legged (varus) or knock   knee (valgus) deformities, often develop increased pain in the arthritic side due to increased loading. Unloader/offloader braces are made to reduce uneven loading in such knees. There is reduction in bowing out movement in bow  legged knees when the correct unloader brace is used. Patients with advanced osteoarthritis or severe varus or valgus alignment problems would not likely benefit from bracing. °Patellofemoral braces help the kneecap to move smoothly and well centered over the end of the femur in the knee.  °Most people who wear knee braces feel that they help. However, there is a lack of scientific evidence that knee braces are helpful at the level needed for athletic participation to prevent injury. In spite of this, athletes report an increase in knee stability, pain relief, performance improvement, and confidence during athletics when using a brace.  °Different knee problems require different knee braces: °· Your caregiver may suggest one kind of knee brace after knee surgery. °· A caregiver may choose another kind of knee brace for support instead of surgery for some types of torn ligaments. °· You may also need one for pain in the front of your knee that is not getting better with strengthening and flexibility exercises. °Get your caregiver's advice if you want to try a knee brace. The caregiver will advise you on where to get them and provide a prescription when it is needed to fashion and/or fit the brace. °Knee braces are the least important part of preventing knee injuries or getting better following injury. Stretching, strengthening and technique improvement are far more important in caring for and preventing knee injuries. When strengthening your knee, increase your activities a little at a time so as not to develop injuries from over use. Work out an exercise plan with your caregiver and/or physical therapist to get the best program for you. Do not let a knee brace become a crutch. °Always remember, there are no braces which support the knee as well as your original ligaments and cartilage you were born with. Conditioning, proper warm-up and stretching remain the most important parts of keeping your knees healthy. °HOW  TO USE A KNEE BRACE °· During sports, knee braces should be used as directed by your caregiver. °· Make sure that the hinges are where the knee bends. °· Straps, tapes, or hook-and-loop tapes should be fastened around your leg as instructed. °· You should check the placement of the brace during activities to make sure that it has not moved. Poorly positioned braces can hurt rather than help you. °· To work well, a knee brace should be worn during all activities that put you at risk of knee injury. °· Warm up properly before beginning athletic activities. °HOME CARE INSTRUCTIONS °· Knee braces often get damaged during normal use. Replace worn-out braces for maximum benefit. °· Clean regularly with soap and water. °· Inspect your brace often for wear and tear. °· Cover exposed metal to protect others from injury. °· Durable materials may cost more, but last longer. °SEEK IMMEDIATE MEDICAL CARE IF:  °· Your knee seems to be getting worse rather than better. °· You have increasing pain or swelling in the knee. °· You have problems caused by the knee brace. °· You have increased swelling or inflammation (redness or soreness) in your knee. °· Your knee becomes warm and more painful and you develop an unexplained temperature over 101° F (38.3° C). °MAKE SURE YOU:  °· Understand these instructions. °· Will watch your condition. °· Will get help right away if   you are not doing well or get worse. °See your caregiver, physical therapist or orthopedic surgeon for additional information. °Document Released: 08/26/2003 Document Revised: 08/28/2011 Document Reviewed: 12/02/2008 °ExitCare® Patient Information ©2014 [employer]. ° °

## 2013-08-05 NOTE — ED Notes (Signed)
Given dose pack of vicodin, 6 pk.

## 2013-08-05 NOTE — ED Notes (Signed)
Pt c/o left knee pain after slipping down a couple ladder steps.

## 2013-08-05 NOTE — ED Notes (Addendum)
Pain rt knee after fall, , Pain mostly in popliteal area.  Surgery to this knee 10 years ago.  Ice pack to knee

## 2013-08-05 NOTE — ED Provider Notes (Signed)
CSN: [id number]     Arrival date & time 08/05/13  1850 History   First MD Initiated Contact with Patient 08/05/13 1859     Chief Complaint  Patient presents with  . Knee Pain     (Consider location/radiation/quality/duration/timing/severity/associated sxs/prior Treatment) HPI Comments: Paul Mccullough is a 43 y.o. male who presents to the Emergency Department complaining of sudden onset of severe right knee pain that began after he fell approx 3 feet off a ladder, landing on his feet.  He states that he felt a sharp pain to the medial and posterior aspect of the right knee and pain is worse with weight bearing. States he cannot bear weight without severe pain.   He has hx of previous ACL repair to same knee.  He denies hip apin, back pain or other injuries.  He has not tried any home therapies.    The history is provided by the patient.    History reviewed. No pertinent past medical history. Past Surgical History  Procedure Laterality Date  . Knee surgery      rt   No family history on file. History  Substance Use Topics  . Smoking status: Current Every Day Smoker -- 1.00 packs/day  . Smokeless tobacco: Not on file  . Alcohol Use: No    Review of Systems  Constitutional: Negative for fever and chills.  Genitourinary: Negative for dysuria and difficulty urinating.  Musculoskeletal: Positive for arthralgias and joint swelling. Negative for back pain, neck pain and neck stiffness.  Skin: Negative for color change and wound.  All other systems reviewed and are negative.      Allergies  Review of patient's allergies indicates no known allergies.  Home Medications  No current outpatient prescriptions on file. BP 151/103  Pulse 101  Temp(Src) 97.9 F (36.6 C) (Oral)  Resp 17  Ht 5\' 6"  (1.676 m)  Wt 160 lb (72.576 kg)  BMI 25.84 kg/m2  SpO2 100% Physical Exam  Nursing note and vitals reviewed. Constitutional: He is oriented to person, place, and time. He appears  well-developed and well-nourished. No distress.  Cardiovascular: Normal rate, regular rhythm, normal heart sounds and intact distal pulses.   No murmur heard. Pulmonary/Chest: Effort normal and breath sounds normal. No respiratory distress.  Musculoskeletal: He exhibits tenderness. He exhibits no edema.  ttp of the medial and posterior right knee.  No erythema, effusion, or step-off deformity.  DP pulse brisk, distal sensation intact. Calf is soft and NT.  No spinal tenderness.  Pt has full ROM of the right hip  Neurological: He is alert and oriented to person, place, and time. He exhibits normal muscle tone. Coordination normal.  Skin: Skin is warm and dry. No erythema.    ED Course  Procedures (including critical care time) Labs Review Labs Reviewed - No data to display Imaging Review Dg Knee Complete 4 Views Right  08/05/2013   CLINICAL DATA:  Right-sided knee pain.  EXAM: RIGHT KNEE - COMPLETE 4+ VIEW  COMPARISON:  No priors.  FINDINGS: Four views of the right knee demonstrate no acute displaced fracture, subluxation, or dislocation. There is a small well corticated bony fragment adjacent to the inferior aspect of the patella along the articular surface, presumably related to remote trauma. No lipohemarthrosis. Postoperative changes from prior ACL repair are noted.  IMPRESSION: No definite acute radiographic abnormality of the right knee.   Electronically Signed   By: Trudie Reedaniel  Entrikin M.D.   On: 08/05/2013 19:40    EKG Interpretation  None       MDM   Final diagnoses:  Knee pain, right   X ray results reviewed and discussed.    Knee immobilizer applied, pain improved, remains NV intact   Right knee exam is concerning for ligamentous injury.  I have arranged for patient to return here tomorrow to have MRI of the right knee.  Scheduled for 2:00 pm.  Patient agrees to care plan and verbalized understanding.  He requests to follow-up with Dr. Hilda Lias.  Referral info given.  The  patient appears reasonably screened and/or stabilized for discharge and I doubt any other medical condition or other [facility] requiring further screening, evaluation, or treatment in the ED at this time prior to discharge.   Berenize Gatlin L. Trisha Mangle, PA-C 08/06/13 2001

## 2013-08-06 ENCOUNTER — Ambulatory Visit (HOSPITAL_COMMUNITY)
Admission: RE | Admit: 2013-08-06 | Discharge: 2013-08-06 | Disposition: A | Discharge status: Home / Self Care | Payer: 59 | Source: Ambulatory Visit | Attending: Emergency Medicine | Admitting: Emergency Medicine

## 2013-08-06 ENCOUNTER — Encounter (HOSPITAL_COMMUNITY): Payer: Self-pay

## 2013-08-06 DIAGNOSIS — IMO0002 Reserved for concepts with insufficient information to code with codable children: Secondary | ICD-10-CM | POA: Insufficient documentation

## 2013-08-06 DIAGNOSIS — M25569 Pain in unspecified knee: Secondary | ICD-10-CM | POA: Insufficient documentation

## 2013-08-06 DIAGNOSIS — M1710 Unilateral primary osteoarthritis, unspecified knee: Secondary | ICD-10-CM | POA: Insufficient documentation

## 2013-08-06 DIAGNOSIS — S99929A Unspecified injury of unspecified foot, initial encounter: Secondary | ICD-10-CM

## 2013-08-06 DIAGNOSIS — W11XXXA Fall on and from ladder, initial encounter: Secondary | ICD-10-CM | POA: Insufficient documentation

## 2013-08-06 DIAGNOSIS — S8990XA Unspecified injury of unspecified lower leg, initial encounter: Secondary | ICD-10-CM | POA: Insufficient documentation

## 2013-08-06 DIAGNOSIS — S99919A Unspecified injury of unspecified ankle, initial encounter: Secondary | ICD-10-CM

## 2013-08-08 NOTE — ED Provider Notes (Signed)
Medical screening examination/treatment/procedure(s) were performed by non-physician practitioner and as supervising physician I was immediately available for consultation/collaboration.  EKG Interpretation   None         Joya Gaskinsonald W Amaris Delafuente, MD 08/08/13 [id number]

## 2013-08-11 MED FILL — Hydrocodone-Acetaminophen Tab 5-325 MG: ORAL | Qty: 6 | Status: AC

## 2013-09-30 ENCOUNTER — Other Ambulatory Visit (HOSPITAL_COMMUNITY): Payer: Self-pay | Admitting: Orthopaedic Surgery

## 2013-09-30 DIAGNOSIS — M25521 Pain in right elbow: Secondary | ICD-10-CM

## 2013-09-30 DIAGNOSIS — M2400 Loose body in unspecified joint: Secondary | ICD-10-CM

## 2013-10-02 ENCOUNTER — Ambulatory Visit (HOSPITAL_COMMUNITY)
Admission: RE | Admit: 2013-10-02 | Discharge: 2013-10-02 | Disposition: A | Discharge status: Home / Self Care | Payer: 59 | Source: Ambulatory Visit | Attending: Orthopaedic Surgery | Admitting: Orthopaedic Surgery

## 2013-10-03 ENCOUNTER — Ambulatory Visit (HOSPITAL_COMMUNITY)
Admission: RE | Admit: 2013-10-03 | Discharge: 2013-10-03 | Disposition: A | Discharge status: Home / Self Care | Payer: 59 | Source: Ambulatory Visit | Attending: Orthopaedic Surgery | Admitting: Orthopaedic Surgery

## 2013-10-03 DIAGNOSIS — M6740 Ganglion, unspecified site: Secondary | ICD-10-CM | POA: Insufficient documentation

## 2013-10-03 DIAGNOSIS — M2400 Loose body in unspecified joint: Secondary | ICD-10-CM

## 2013-10-03 DIAGNOSIS — M25529 Pain in unspecified elbow: Secondary | ICD-10-CM | POA: Insufficient documentation

## 2013-10-03 DIAGNOSIS — M25521 Pain in right elbow: Secondary | ICD-10-CM

## 2013-10-03 DIAGNOSIS — M25429 Effusion, unspecified elbow: Secondary | ICD-10-CM | POA: Insufficient documentation

## 2013-10-03 DIAGNOSIS — M19029 Primary osteoarthritis, unspecified elbow: Secondary | ICD-10-CM | POA: Insufficient documentation

## 2013-10-03 DIAGNOSIS — M25629 Stiffness of unspecified elbow, not elsewhere classified: Secondary | ICD-10-CM | POA: Insufficient documentation

## 2013-10-03 DIAGNOSIS — M24029 Loose body in unspecified elbow: Secondary | ICD-10-CM | POA: Insufficient documentation

## 2013-10-23 ENCOUNTER — Encounter (HOSPITAL_COMMUNITY): Payer: Self-pay | Admitting: Emergency Medicine

## 2013-10-23 ENCOUNTER — Emergency Department (HOSPITAL_COMMUNITY)
Admission: EM | Admit: 2013-10-23 | Discharge: 2013-10-23 | Disposition: A | Discharge status: Home / Self Care | Payer: 59 | Attending: Emergency Medicine | Admitting: Emergency Medicine

## 2013-10-23 DIAGNOSIS — K089 Disorder of teeth and supporting structures, unspecified: Secondary | ICD-10-CM | POA: Insufficient documentation

## 2013-10-23 DIAGNOSIS — I10 Essential (primary) hypertension: Secondary | ICD-10-CM | POA: Insufficient documentation

## 2013-10-23 DIAGNOSIS — F17200 Nicotine dependence, unspecified, uncomplicated: Secondary | ICD-10-CM | POA: Insufficient documentation

## 2013-10-23 DIAGNOSIS — K029 Dental caries, unspecified: Secondary | ICD-10-CM | POA: Insufficient documentation

## 2013-10-23 DIAGNOSIS — K0889 Other specified disorders of teeth and supporting structures: Secondary | ICD-10-CM

## 2013-10-23 HISTORY — DX: Essential (primary) hypertension: I10

## 2013-10-23 MED ORDER — CLINDAMYCIN HCL 300 MG PO CAPS: 300.0000 mg | ORAL_CAPSULE | Freq: Four times a day (QID) | ORAL | Status: AC

## 2013-10-23 MED ORDER — TRAMADOL HCL 50 MG PO TABS: 50.0000 mg | ORAL_TABLET | Freq: Four times a day (QID) | ORAL | Status: AC | PRN

## 2013-10-23 NOTE — ED Notes (Signed)
Mult dental caries.  Alert

## 2013-10-23 NOTE — Discharge Instructions (Signed)
Dental Pain °Toothache is pain in or around a tooth. It may get worse with chewing or with cold or heat.  °HOME CARE °· Your dentist may use a numbing medicine during treatment. If so, you may need to avoid eating until the medicine wears off. Ask your dentist about this. °· Only take medicine as told by your dentist or doctor. °· Avoid chewing food near the painful tooth until after all treatment is done. Ask your dentist about this. °GET HELP RIGHT AWAY IF:  °· The problem gets worse or new problems appear. °· You have a fever. °· There is redness and puffiness (swelling) of the face, jaw, or neck. °· You cannot open your mouth. °· There is pain in the jaw. °· There is very bad pain that is not helped by medicine. °MAKE SURE YOU:  °· Understand these instructions. °· Will watch your condition. °· Will get help right away if you are not doing well or get worse. °Document Released: 11/22/2007 Document Revised: 08/28/2011 Document Reviewed: 11/22/2007 °[employer]® Patient Information ©2014 [employer]. ° °

## 2013-10-23 NOTE — ED Notes (Signed)
Patient c/o right upper dental pain x3 days. Per patient has taken ibuprofen with no relief. Per patient trying to get dentist appointment but most recent appointment he could get was next Thursday.

## 2013-10-26 NOTE — ED Provider Notes (Signed)
CSN: [id number]     Arrival date & time 10/23/13  1719 History   First MD Initiated Contact with Patient 10/23/13 1823     Chief Complaint  Patient presents with  . Dental Pain     (Consider location/radiation/quality/duration/timing/severity/associated sxs/prior Treatment) Patient is a 43 y.o. male presenting with tooth pain. The history is provided by the patient.  Dental Pain Location:  Upper Upper teeth location: Multiple upper teeth including incisors, cuspids and bicuspids. Quality:  Aching and throbbing Severity:  Moderate Onset quality:  Gradual Duration:  3 days Timing:  Constant Progression:  Worsening Chronicity:  New Context: dental caries and poor dentition   Context: not abscess, filling still in place, not malocclusion and not trauma   Relieved by:  Nothing Worsened by:  Cold food/drink and hot food/drink Ineffective treatments:  Acetaminophen and topical anesthetic gel Associated symptoms: no congestion, no difficulty swallowing, no drooling, no facial pain, no facial swelling, no fever, no gum swelling, no headaches, no neck pain, no neck swelling, no oral bleeding, no oral lesions and no trismus   Risk factors: lack of dental care, periodontal disease and smoking   Risk factors: no diabetes     Past Medical History  Diagnosis Date  . Hypertension    Past Surgical History  Procedure Laterality Date  . Knee surgery      rt   History reviewed. No pertinent family history. History  Substance Use Topics  . Smoking status: Current Every Day Smoker -- 1.00 packs/day for 20 years    Types: Cigarettes  . Smokeless tobacco: Never Used  . Alcohol Use: No    Review of Systems  Constitutional: Negative for fever and appetite change.  HENT: Positive for dental problem. Negative for congestion, drooling, facial swelling, mouth sores, sore throat and trouble swallowing.   Eyes: Negative for pain and visual disturbance.  Musculoskeletal: Negative for neck pain and  neck stiffness.  Neurological: Negative for dizziness, facial asymmetry and headaches.  Hematological: Negative for adenopathy.  All other systems reviewed and are negative.     Allergies  Review of patient's allergies indicates no known allergies.  Home Medications   Prior to Admission medications   Medication Sig Start Date End Date Taking? Authorizing Provider  clindamycin (CLEOCIN) 300 MG capsule Take 1 capsule (300 mg total) by mouth 4 (four) times daily. For 10 days 10/23/13   Mishell Donalson L. Saamir Armstrong, PA-C  traMADol (ULTRAM) 50 MG tablet Take 1 tablet (50 mg total) by mouth every 6 (six) hours as needed. 10/23/13   Kasper Mudrick L. Antwian Santaana, PA-C   BP 165/100  Temp(Src) 98.1 F (36.7 C) (Oral)  Resp 16  Ht 5\' 8"  (1.727 m)  Wt 170 lb (77.111 kg)  BMI 25.85 kg/m2  SpO2 100% Physical Exam  Nursing note and vitals reviewed. Constitutional: He is oriented to person, place, and time. He appears well-developed and well-nourished. No distress.  HENT:  Head: Normocephalic and atraumatic.  Right Ear: Tympanic membrane and ear canal normal.  Left Ear: Tympanic membrane and ear canal normal.  Mouth/Throat: Uvula is midline, oropharynx is clear and moist and mucous membranes are normal. No trismus in the jaw. Dental caries present. No dental abscesses or uvula swelling.  Multiple dental caries and widespread dental disease is present, most of the upper teeth are decayed to the gumline and diffusely tender to palpation. No facial swelling, obvious dental abscess, trismus, or sublingual abnml.    Neck: Normal range of motion. Neck supple.  Cardiovascular: Normal  rate, regular rhythm and normal heart sounds.   No murmur heard. Pulmonary/Chest: Effort normal and breath sounds normal.  Musculoskeletal: Normal range of motion.  Lymphadenopathy:    He has no cervical adenopathy.  Neurological: He is alert and oriented to person, place, and time. He exhibits normal muscle tone. Coordination normal.  Skin:  Skin is warm and dry.    ED Course  Procedures (including critical care time) Labs Review Labs Reviewed - No data to display  Imaging Review No results found.   EKG Interpretation None      MDM   Final diagnoses:  Pain, dental   Patient with history of multiple dental caries and dental disease. No concerning symptoms for infection to the floor of the mouth or deep structures of the neck. Patient agrees to clindamycin and Ultram  he was given referral to local dentistry. Vital signs are stable, patient agrees to care plan and appears stable for discharge   Lameka Disla L. Toddy Boyd, PA-C 10/26/13 0147

## 2013-10-26 NOTE — ED Provider Notes (Signed)
  Medical screening examination/treatment/procedure(s) were performed by non-physician practitioner and as supervising physician I was immediately available for consultation/collaboration.   EKG Interpretation None         Murielle Stang, MD 10/26/13 0719 

## 2014-10-15 ENCOUNTER — Emergency Department (HOSPITAL_COMMUNITY)
Admission: EM | Admit: 2014-10-15 | Discharge: 2014-10-15 | Disposition: A | Discharge status: Home / Self Care | Payer: No Typology Code available for payment source | Attending: Emergency Medicine | Admitting: Emergency Medicine

## 2014-10-15 ENCOUNTER — Emergency Department (HOSPITAL_COMMUNITY): Payer: No Typology Code available for payment source

## 2014-10-15 ENCOUNTER — Encounter (HOSPITAL_COMMUNITY): Payer: Self-pay

## 2014-10-15 DIAGNOSIS — Y9389 Activity, other specified: Secondary | ICD-10-CM | POA: Diagnosis not present

## 2014-10-15 DIAGNOSIS — I10 Essential (primary) hypertension: Secondary | ICD-10-CM | POA: Insufficient documentation

## 2014-10-15 DIAGNOSIS — Y92410 Unspecified street and highway as the place of occurrence of the external cause: Secondary | ICD-10-CM | POA: Diagnosis not present

## 2014-10-15 DIAGNOSIS — Y998 Other external cause status: Secondary | ICD-10-CM | POA: Insufficient documentation

## 2014-10-15 DIAGNOSIS — S199XXA Unspecified injury of neck, initial encounter: Secondary | ICD-10-CM | POA: Diagnosis present

## 2014-10-15 DIAGNOSIS — Z720 Tobacco use: Secondary | ICD-10-CM | POA: Insufficient documentation

## 2014-10-15 DIAGNOSIS — S161XXA Strain of muscle, fascia and tendon at neck level, initial encounter: Secondary | ICD-10-CM | POA: Diagnosis not present

## 2014-10-15 MED ORDER — IBUPROFEN 800 MG PO TABS
800.0000 mg | ORAL_TABLET | Freq: Once | ORAL | Status: AC
  Administered 2014-10-15: 800 mg via ORAL
  Filled 2014-10-15: qty 1

## 2014-10-15 MED ORDER — NAPROXEN 500 MG PO TABS: 500.0000 mg | ORAL_TABLET | Freq: Two times a day (BID) | ORAL | Status: AC

## 2014-10-15 MED ORDER — METHOCARBAMOL 500 MG PO TABS: 500.0000 mg | ORAL_TABLET | Freq: Two times a day (BID) | ORAL | Status: AC

## 2014-10-15 NOTE — Discharge Instructions (Signed)
Do not take the muscle relaxant if driving as it will make you sleepy. Return as needed for problems.
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
## 2015-07-29 ENCOUNTER — Encounter: Payer: Self-pay | Admitting: Orthopaedic Surgery

## 2015-07-29 ENCOUNTER — Ambulatory Visit (INDEPENDENT_AMBULATORY_CARE_PROVIDER_SITE_OTHER): Payer: Self-pay | Admitting: Orthopaedic Surgery

## 2015-07-29 VITALS — BP 167/106 | HR 92 | Temp 98.2°F | Ht 68.0 in | Wt 157.8 lb

## 2015-07-29 DIAGNOSIS — M25511 Pain in right shoulder: Secondary | ICD-10-CM

## 2015-07-29 DIAGNOSIS — M7711 Lateral epicondylitis, right elbow: Secondary | ICD-10-CM

## 2015-07-29 MED ORDER — HYDROCODONE-ACETAMINOPHEN 7.5-325 MG PO TABS: 1.0000 | ORAL_TABLET | ORAL | Status: DC | PRN

## 2015-07-29 NOTE — Patient Instructions (Signed)
Try over the counter aspercreme with lidocaine

## 2015-07-31 DIAGNOSIS — M7711 Lateral epicondylitis, right elbow: Secondary | ICD-10-CM | POA: Insufficient documentation

## 2015-07-31 DIAGNOSIS — M25511 Pain in right shoulder: Secondary | ICD-10-CM | POA: Insufficient documentation

## 2015-07-31 NOTE — Progress Notes (Signed)
Patient Paul Mccullough, male DOB:[birth year], 45 y.o. AOZ:[id number]  Chief Complaint  Patient presents with  . Follow-up    Right knee and shoulder pain "bad"    HPI  Paul Mccullough is a 44 y.o. male who has chronic right lateral epicondylitis and now has some right shoulder pain.  His shoulder pain is worse with activity and with cold weather.  He is doing his ice massage for his right elbow with some help.  He has no paresthesias.  HPI  Body mass index is 24 kg/(m^2).  Review of Systems  Patient does not have Diabetes Mellitus. Patient has hypertension. Patient does not have COPD or shortness of breath. Patient does not have BMI > 35. Patient has current smoking history.  Review of Systems  Past Medical History  Diagnosis Date  . Hypertension   . Hypertension     Past Surgical History  Procedure Laterality Date  . Knee surgery      rt  . Knee surgery      History reviewed. No pertinent family history.  Social History Social History  Substance Use Topics  . Smoking status: Current Every Day Smoker -- 1.00 packs/day for 20 years    Types: Cigarettes  . Smokeless tobacco: Never Used  . Alcohol Use: No    No Known Allergies  Current Outpatient Prescriptions  Medication Sig Dispense Refill  . ibuprofen (ADVIL,MOTRIN) 200 MG tablet Take 200 mg by mouth every 6 (six) hours as needed.    . clindamycin (CLEOCIN) 300 MG capsule Take 1 capsule (300 mg total) by mouth 4 (four) times daily. For 10 days (Patient not taking: Reported on 10/15/2014) 40 capsule 0  . HYDROcodone-acetaminophen (NORCO) 7.5-325 MG tablet Take 1 tablet by mouth every 4 (four) hours as needed for moderate pain (Must last 30 days.  Do not drive or operate machinery while taking this medicine.). 120 tablet 0  . methocarbamol (ROBAXIN) 500 MG tablet Take 1 tablet (500 mg total) by mouth 2 (two) times daily. (Patient not taking: Reported on 07/29/2015) 20 tablet 0  . naproxen (NAPROSYN) 500 MG tablet  Take 1 tablet (500 mg total) by mouth 2 (two) times daily. (Patient not taking: Reported on 07/29/2015) 20 tablet 0  . traMADol (ULTRAM) 50 MG tablet Take 1 tablet (50 mg total) by mouth every 6 (six) hours as needed. (Patient not taking: Reported on 10/15/2014) 20 tablet 0   No current facility-administered medications for this visit.     Physical Exam  Blood pressure 167/106, pulse 92, temperature 98.2 F (36.8 C), height  (1.727 m), weight 157 lb 12.8 oz (71.578 kg).  Constitutional: overall normal hygiene, normal nutrition, well developed, normal grooming, normal body habitus. Assistive device:none  Musculoskeletal: gait and station Limp none, muscle tone and strength are normal, no tremors or atrophy is present.  .  Neurological: coordination overall normal.  Deep tendon reflex/nerve stretch intact.  Sensation normal.  Cranial nerves II-XII intact.   Skin:normal overall no scars, lesions, ulcers or rash es. No psoriasis.  Psychiatric: Alert and oriented x 3.  Recent memory intact, remote memory unclear.  Normal mood and affect. Well groomed.  Good eye contact.  Cardiovascular: overall no swelling, no varicosities, no edema bilaterally, normal temperatures of the legs and arms, no clubbing, cyanosis and good capillary refill.  Lymphatic: palpation is normal.   Extremities:the right shoulder has generalized diffuse glenohumeral tenderness with no crepitus.  His right lateral epicondyle is tender with positive pain to resisted  extension of the right wrist. Inspection normal Strength and tone normal Range of motion full  Additional services performed: none  PLAN Call if any problems.  Precautions discussed.  Continue current medications.   Return to clinic 3 months.

## 2015-08-25 ENCOUNTER — Telehealth: Payer: Self-pay | Admitting: Orthopaedic Surgery

## 2015-08-25 MED ORDER — HYDROCODONE-ACETAMINOPHEN 7.5-325 MG PO TABS: 1.0000 | ORAL_TABLET | ORAL | Status: DC | PRN

## 2015-08-25 NOTE — Telephone Encounter (Signed)
Rx done. 

## 2015-08-25 NOTE — Telephone Encounter (Signed)
Patient called and requested a refill on Hydrocodone 7.5-325 mgs. Qty 120.  This was last filled on 07-29-15

## 2015-08-26 ENCOUNTER — Ambulatory Visit: Payer: Self-pay | Admitting: Orthopaedic Surgery

## 2015-09-23 ENCOUNTER — Telehealth: Payer: Self-pay | Admitting: Orthopaedic Surgery

## 2015-09-23 MED ORDER — HYDROCODONE-ACETAMINOPHEN 7.5-325 MG PO TABS: 1.0000 | ORAL_TABLET | ORAL | Status: DC | PRN

## 2015-09-23 NOTE — Telephone Encounter (Signed)
Patient requests refill on medication: HYDROcodone-acetaminophen (NORCO) 7.5-325 MG tablet [[id number]][[id number]] / quanity of 120.  Please advise.

## 2015-09-23 NOTE — Telephone Encounter (Signed)
Rx done. 

## 2015-10-21 ENCOUNTER — Telehealth: Payer: Self-pay | Admitting: Orthopaedic Surgery

## 2015-10-21 MED ORDER — HYDROCODONE-ACETAMINOPHEN 5-325 MG PO TABS: 1.0000 | ORAL_TABLET | ORAL | Status: AC | PRN

## 2015-10-21 NOTE — Telephone Encounter (Signed)
Hydrocodone-Acetaminophen 7.5/325mg Qty 120 Tablets °

## 2015-10-21 NOTE — Telephone Encounter (Signed)
Rx done. 

## 2015-10-28 ENCOUNTER — Encounter: Payer: Self-pay | Admitting: Orthopaedic Surgery

## 2015-10-28 ENCOUNTER — Ambulatory Visit: Payer: Self-pay | Admitting: Orthopaedic Surgery
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
